# Patient Record
Sex: Male | Born: 2000 | Hispanic: No | Marital: Single
Health system: Southern US, Community
[De-identification: ages and names within clinical notes are randomized; demographics above are authoritative.]

---
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
## 2023-02-21 ENCOUNTER — Emergency Department (HOSPITAL_COMMUNITY): Payer: Managed Care, Other (non HMO)

## 2023-02-21 ENCOUNTER — Other Ambulatory Visit: Payer: Self-pay

## 2023-02-21 ENCOUNTER — Emergency Department (HOSPITAL_COMMUNITY)
Admission: EM | Admit: 2023-02-21 | Discharge: 2023-02-21 | Disposition: A | Discharge status: Home / Self Care | Payer: Managed Care, Other (non HMO) | Attending: Emergency Medicine | Admitting: Emergency Medicine

## 2023-02-21 DIAGNOSIS — S300XXA Contusion of lower back and pelvis, initial encounter: Secondary | ICD-10-CM | POA: Diagnosis not present

## 2023-02-21 DIAGNOSIS — W01198A Fall on same level from slipping, tripping and stumbling with subsequent striking against other object, initial encounter: Secondary | ICD-10-CM | POA: Diagnosis not present

## 2023-02-21 DIAGNOSIS — S022XXA Fracture of nasal bones, initial encounter for closed fracture: Secondary | ICD-10-CM | POA: Insufficient documentation

## 2023-02-21 DIAGNOSIS — S01311A Laceration without foreign body of right ear, initial encounter: Secondary | ICD-10-CM | POA: Insufficient documentation

## 2023-02-21 DIAGNOSIS — S0993XA Unspecified injury of face, initial encounter: Secondary | ICD-10-CM | POA: Diagnosis present

## 2023-02-21 DIAGNOSIS — S0011XA Contusion of right eyelid and periocular area, initial encounter: Secondary | ICD-10-CM | POA: Diagnosis not present

## 2023-02-21 DIAGNOSIS — S0083XA Contusion of other part of head, initial encounter: Secondary | ICD-10-CM | POA: Insufficient documentation

## 2023-02-21 DIAGNOSIS — H1133 Conjunctival hemorrhage, bilateral: Secondary | ICD-10-CM | POA: Diagnosis not present

## 2023-02-21 DIAGNOSIS — Y92009 Unspecified place in unspecified non-institutional (private) residence as the place of occurrence of the external cause: Secondary | ICD-10-CM | POA: Insufficient documentation

## 2023-02-21 DIAGNOSIS — S0990XA Unspecified injury of head, initial encounter: Secondary | ICD-10-CM

## 2023-02-21 DIAGNOSIS — S0012XA Contusion of left eyelid and periocular area, initial encounter: Secondary | ICD-10-CM | POA: Insufficient documentation

## 2023-02-21 LAB — COMPREHENSIVE METABOLIC PANEL
ALT: 67 U/L — ABNORMAL HIGH (ref 0–44)
AST: 97 U/L — ABNORMAL HIGH (ref 15–41)
Albumin: 4.9 g/dL (ref 3.5–5.0)
Alkaline Phosphatase: 71 U/L (ref 38–126)
Anion gap: 10 (ref 5–15)
BUN: 15 mg/dL (ref 6–20)
CO2: 24 mmol/L (ref 22–32)
Calcium: 9.2 mg/dL (ref 8.9–10.3)
Chloride: 107 mmol/L (ref 98–111)
Creatinine, Ser: 1.07 mg/dL (ref 0.61–1.24)
GFR, Estimated: >60 mL/min (ref >60)
Glucose, Bld: 98 mg/dL (ref 70–99)
Potassium: 3.7 mmol/L (ref 3.5–5.1)
Sodium: 141 mmol/L (ref 135–145)
Total Bilirubin: 1 mg/dL (ref 0.3–1.2)
Total Protein: 8.1 g/dL (ref 6.5–8.1)

## 2023-02-21 LAB — CBC WITH DIFFERENTIAL/PLATELET
Abs Immature Granulocytes: 0.04 10*3/uL (ref 0.00–0.07)
Basophils Absolute: 0 10*3/uL (ref 0.0–0.1)
Basophils Relative: 0 %
Eosinophils Absolute: 0 10*3/uL (ref 0.0–0.5)
Eosinophils Relative: 0 %
HCT: 45.7 % (ref 39.0–52.0)
Hemoglobin: 15.5 g/dL (ref 13.0–17.0)
Immature Granulocytes: 0 %
Lymphocytes Relative: 23 %
Lymphs Abs: 2.6 10*3/uL (ref 0.7–4.0)
MCH: 29.8 pg (ref 26.0–34.0)
MCHC: 33.9 g/dL (ref 30.0–36.0)
MCV: 87.7 fL (ref 80.0–100.0)
Monocytes Absolute: 1.2 10*3/uL — ABNORMAL HIGH (ref 0.1–1.0)
Monocytes Relative: 11 %
Neutro Abs: 7.3 10*3/uL (ref 1.7–7.7)
Neutrophils Relative %: 66 %
Platelets: 357 10*3/uL (ref 150–400)
RBC: 5.21 MIL/uL (ref 4.22–5.81)
RDW: 12.8 % (ref 11.5–15.5)
WBC: 11.1 10*3/uL — ABNORMAL HIGH (ref 4.0–10.5)
nRBC: 0 % (ref 0.0–0.2)

## 2023-02-21 MED ORDER — KETOROLAC TROMETHAMINE 30 MG/ML IJ SOLN
30.0000 mg | Freq: Once | INTRAMUSCULAR | Status: AC
  Administered 2023-02-21: 30 mg via INTRAVENOUS
  Filled 2023-02-21: qty 1

## 2023-02-21 MED ORDER — NEOMYCIN-POLYMYXIN-HC 3.5-10000-1 OT SUSP: 4.0000 [drp] | Freq: Four times a day (QID) | OTIC | 0 refills | Status: AC

## 2023-02-21 MED ORDER — IBUPROFEN 800 MG PO TABS: 800.0000 mg | ORAL_TABLET | Freq: Three times a day (TID) | ORAL | 0 refills | Status: AC | PRN
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
## 2023-02-21 NOTE — Discharge Instructions (Addendum)
1.  Use the eardrops as prescribed in your right ear. 2.  Take ibuprofen 800 mg every 8 hours for pain control.  You may also take extra strength Tylenol every 6 hours per package instructions. 3.  Keep your head elevated at about 30 degrees while sleeping or resting.  This will help with swelling.  You may apply ice packs for swelling around the eyes and nose.  Frozen vegetables work well.  Place a towel between the ice pack and your skin to protect your skin. 4.  Addressing with antibiotic ointment has been placed on your earlobe.  The skin from your earlobe is missing and has already started to heal.  It will need to heal and then you can decide if you wish to have surgical repair for a more aesthetic looking earlobe.  Can discuss this with Dr. Constance Holster. 5.  Return to the emergency department immediately if you have new or concerning symptoms develop.  Review symptoms regarding head injury.
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
